# Patient Record
Sex: Female | Born: 1985 | Race: White | Hispanic: No | Marital: Single | Smoking: Never smoker
Health system: Southern US, Community
[De-identification: ages and names within clinical notes are randomized; demographics above are authoritative.]

---

## 2015-11-13 ENCOUNTER — Emergency Department: Payer: BLUE CROSS/BLUE SHIELD

## 2015-11-13 ENCOUNTER — Emergency Department
Admission: EM | Admit: 2015-11-13 | Discharge: 2015-11-13 | Disposition: A | Discharge status: Home / Self Care | Payer: BLUE CROSS/BLUE SHIELD | Attending: Emergency Medicine | Admitting: Emergency Medicine

## 2015-11-13 ENCOUNTER — Encounter: Payer: Self-pay | Admitting: *Deleted

## 2015-11-13 DIAGNOSIS — R509 Fever, unspecified: Secondary | ICD-10-CM | POA: Diagnosis not present

## 2015-11-13 DIAGNOSIS — R079 Chest pain, unspecified: Secondary | ICD-10-CM | POA: Insufficient documentation

## 2015-11-13 DIAGNOSIS — R739 Hyperglycemia, unspecified: Secondary | ICD-10-CM | POA: Diagnosis not present

## 2015-11-13 LAB — RAPID INFLUENZA A&B ANTIGENS (ARMC ONLY): INFLUENZA A (ARMC): NEGATIVE

## 2015-11-13 LAB — URINALYSIS COMPLETE WITH MICROSCOPIC (ARMC ONLY)
Bacteria, UA: NONE SEEN
Bilirubin Urine: NEGATIVE
Glucose, UA: >500 mg/dL — AB
HGB URINE DIPSTICK: NEGATIVE
Ketones, ur: NEGATIVE mg/dL
Leukocytes, UA: NEGATIVE
Nitrite: NEGATIVE
PROTEIN: NEGATIVE mg/dL
SPECIFIC GRAVITY, URINE: 1.001 — AB (ref 1.005–1.030)
WBC UA: NONE SEEN WBC/hpf (ref 0–5)
pH: 7 (ref 5.0–8.0)

## 2015-11-13 LAB — RAPID INFLUENZA A&B ANTIGENS: Influenza B (ARMC): NEGATIVE

## 2015-11-13 LAB — BASIC METABOLIC PANEL
Anion gap: 10 (ref 5–15)
BUN: 7 mg/dL (ref 6–20)
CALCIUM: 8.6 mg/dL — AB (ref 8.9–10.3)
CHLORIDE: 103 mmol/L (ref 101–111)
CO2: 20 mmol/L — AB (ref 22–32)
CREATININE: 0.87 mg/dL (ref 0.44–1.00)
GFR calc non Af Amer: >60 mL/min (ref >60)
GLUCOSE: 262 mg/dL — AB (ref 65–99)
Potassium: 3.5 mmol/L (ref 3.5–5.1)
Sodium: 133 mmol/L — ABNORMAL LOW (ref 135–145)

## 2015-11-13 LAB — POCT RAPID STREP A: Streptococcus, Group A Screen (Direct): NEGATIVE

## 2015-11-13 LAB — TROPONIN I: Troponin I: <0.03 ng/mL

## 2015-11-13 LAB — CBC
HCT: 39.3 % (ref 35.0–47.0)
Hemoglobin: 13.3 g/dL (ref 12.0–16.0)
MCH: 29.7 pg (ref 26.0–34.0)
MCHC: 33.7 g/dL (ref 32.0–36.0)
MCV: 88 fL (ref 80.0–100.0)
Platelets: 171 10*3/uL (ref 150–440)
RBC: 4.47 MIL/uL (ref 3.80–5.20)
RDW: 13.2 % (ref 11.5–14.5)
WBC: 3.5 10*3/uL — ABNORMAL LOW (ref 3.6–11.0)

## 2015-11-13 LAB — POCT PREGNANCY, URINE: Preg Test, Ur: NEGATIVE

## 2015-11-13 MED ORDER — METFORMIN HCL 500 MG PO TABS
500.0000 mg | ORAL_TABLET | Freq: Two times a day (BID) | ORAL | Status: AC
Start: 2015-11-13 — End: 2016-11-12

## 2015-11-13 MED ORDER — IBUPROFEN 800 MG PO TABS: 800.0000 mg | ORAL_TABLET | Freq: Three times a day (TID) | ORAL | Status: AC | PRN

## 2015-11-13 MED ORDER — IOPAMIDOL (ISOVUE-370) INJECTION 76%
75.0000 mL | Freq: Once | INTRAVENOUS | Status: AC | PRN
  Administered 2015-11-13: 75 mL via INTRAVENOUS

## 2015-11-13 MED ORDER — IBUPROFEN 800 MG PO TABS
800.0000 mg | ORAL_TABLET | Freq: Once | ORAL | Status: AC
  Administered 2015-11-13: 800 mg via ORAL
  Filled 2015-11-13: qty 1

## 2015-11-13 MED ORDER — METFORMIN HCL 500 MG PO TABS
500.0000 mg | ORAL_TABLET | Freq: Once | ORAL | Status: AC
  Administered 2015-11-13: 500 mg via ORAL
  Filled 2015-11-13: qty 1

## 2015-11-13 NOTE — ED Provider Notes (Signed)
[facility] Emergency Department Provider Note        Time seen: ----------------------------------------- 8:25 PM on 11/13/2015 -----------------------------------------    I have reviewed the triage vital signs and the nursing notes.   HISTORY  Chief Complaint Chest Pain    HPI Diane Nunez is a 30 y.o. female who presents ER for chest pain for last 2 days. She states the pain was worse since yesterday, also reports a fever. Fever started today with some chills. She has had some aches including headache and pressure Brown or ears. She denies any cough or nausea or vomiting or diarrhea.   No past medical history on file.  There are no active problems to display for this patient.   No past surgical history on file.  Allergies Percocet  Social History Social History  Substance Use Topics  . Smoking status: Never Smoker   . Smokeless tobacco: Not on file  . Alcohol Use: Yes    Review of Systems Constitutional: Positive for fever and chills Eyes: Negative for visual changes. ENT: Negative for sore throat. Cardiovascular: Positive for chest pain Respiratory: Negative for shortness of breath. Gastrointestinal: Negative for abdominal pain, vomiting and diarrhea. Genitourinary: Negative for dysuria. Musculoskeletal: Negative for back pain. Skin: Negative for rash. Neurological: Negative for headaches, focal weakness or numbness.  10-point ROS otherwise negative.  ____________________________________________   PHYSICAL EXAM:  VITAL SIGNS: ED Triage Vitals  Enc Vitals Group     BP 11/13/15 1921 160/79 mmHg     Pulse Rate 11/13/15 1921 102     Resp 11/13/15 1921 22     Temp 11/13/15 1921 100.3 F (37.9 C)     Temp Source 11/13/15 1921 Oral     SpO2 11/13/15 1921 100 %     Weight 11/13/15 1921 180 lb (81.647 kg)     Height 11/13/15 1921 5\' 2"  (1.575 m)     Head Cir --      Peak Flow --      Pain Score 11/13/15 1919 8      Pain Loc --      Pain Edu? --      Excl. in GC? --    Constitutional: Alert and oriented. Well appearing and in no distress. Eyes: Conjunctivae are normal. PERRL. Normal extraocular movements. ENT   Head: Normocephalic and atraumatic.   Nose: No congestion/rhinnorhea.   Mouth/Throat: Mucous membranes are moist.   Neck: No stridor. Cardiovascular: Normal rate, regular rhythm. No murmurs, rubs, or gallops. Respiratory: Normal respiratory effort without tachypnea nor retractions. Breath sounds are clear and equal bilaterally. No wheezes/rales/rhonchi. Gastrointestinal: Soft and nontender. Normal bowel sounds Musculoskeletal: Nontender with normal range of motion in all extremities. No lower extremity tenderness nor edema. Neurologic:  Normal speech and language. No gross focal neurologic deficits are appreciated.  Skin:  Skin is warm, dry and intact. No rash noted. Psychiatric: Mood and affect are normal. Speech and behavior are normal.  ____________________________________________  EKG: Interpreted by me. Sinus tachycardia with a rate of 102 bpm, normal PR interval, normal QT interval. Voltage criteria for LVH, nonspecific T-wave changes. Normal axis.  ____________________________________________  ED COURSE:  Pertinent labs & imaging results that were available during my care of the patient were reviewed by me and considered in my medical decision making (see chart for details). Patient presents with vague complaints of fever and chest pain. I will check basic labs and imaging. ____________________________________________    LABS (pertinent positives/negatives)  Labs Reviewed  CBC -  Abnormal; Notable for the following:    WBC 3.5 (*)    All other components within normal limits  BASIC METABOLIC PANEL - Abnormal; Notable for the following:    Sodium 133 (*)    CO2 20 (*)    Glucose, Bld 262 (*)    Calcium 8.6 (*)    All other components within normal limits   URINALYSIS COMPLETEWITH MICROSCOPIC ([facility] ONLY) - Abnormal; Notable for the following:    Color, Urine STRAW (*)    APPearance CLEAR (*)    Glucose, UA >500 (*)    Specific Gravity, Urine 1.001 (*)    Squamous Epithelial / LPF 0-5 (*)    All other components within normal limits  RAPID INFLUENZA A&B ANTIGENS ([facility] ONLY)  TROPONIN I  POCT PREGNANCY, URINE  POCT RAPID STREP A    RADIOLOGY  Chest x-ray IMPRESSION: No edema or consolidation. IMPRESSION: Negative for acute pulmonary embolism. There is diffuse fatty infiltration of the liver. No focal liver lesions are evident, but the entire liver is not within the field of view of this study. ____________________________________________  FINAL ASSESSMENT AND PLAN  Fever, chest pain, Hyperglycemia  Plan: Patient with labs and imaging as dictated above. Patient presents to ER with fever, viral symptoms and chest pain. Unclear etiology, will encourage Motrin 3 times a day to cover for pericarditis if she has a mild case of it. CT scan was unremarkable, she also appears to have developed type 2 diabetes and will be started on metformin. She is advised follow-up in 1-2 days for  Recheck with her doctor.   Emily Filbert, MD   Note: This dictation was prepared with Dragon dictation. Any transcriptional errors that result from this process are unintentional   Emily Filbert, MD 11/13/15 2228

## 2015-11-13 NOTE — Discharge Instructions (Signed)
Nonspecific Chest Pain It is often hard to find the cause of chest pain. There is always a chance that your pain could be related to something serious, such as a heart attack or a blood clot in your lungs. Chest pain can also be caused by conditions that are not life-threatening. If you have chest pain, it is very important to follow up with your doctor.  HOME CARE  If you were prescribed an antibiotic medicine, finish it all even if you start to feel better.  Avoid any activities that cause chest pain.  Do not use any tobacco products, including cigarettes, chewing tobacco, or electronic cigarettes. If you need help quitting, ask your doctor.  Do not drink alcohol.  Take medicines only as told by your doctor.  Keep all follow-up visits as told by your doctor. This is important. This includes any further testing if your chest pain does not go away.  Your doctor may tell you to keep your head raised (elevated) while you sleep.  Make lifestyle changes as told by your doctor. These may include:  Getting regular exercise. Ask your doctor to suggest some activities that are safe for you.  Eating a heart-healthy diet. Your doctor or a diet specialist (dietitian) can help you to learn healthy eating options.  Maintaining a healthy weight.  Managing diabetes, if necessary.  Reducing stress. GET HELP IF:  Your chest pain does not go away, even after treatment.  You have a rash with blisters on your chest.  You have a fever. GET HELP RIGHT AWAY IF:  Your chest pain is worse.  You have an increasing cough, or you cough up blood.  You have severe belly (abdominal) pain.  You feel extremely weak.  You pass out (faint).  You have chills.  You have sudden, unexplained chest discomfort.  You have sudden, unexplained discomfort in your arms, back, neck, or jaw.  You have shortness of breath at any time.  You suddenly start to sweat, or your skin gets clammy.  You feel  nauseous.  You vomit.  You suddenly feel light-headed or dizzy. Hyperglycemia Hyperglycemia occurs when the glucose (sugar) in your blood is too high. Hyperglycemia can happen for many reasons, but it most often happens to people who do not know they have diabetes or are not managing their diabetes properly.  CAUSES  Whether you have diabetes or not, there are other causes of hyperglycemia. Hyperglycemia can occur when you have diabetes, but it can also occur in other situations that you might not be as aware of, such as: Diabetes If you have diabetes and are having problems controlling your blood glucose, hyperglycemia could occur because of some of the following reasons: Not following your meal plan. Not taking your diabetes medications or not taking it properly. Exercising less or doing less activity than you normally do. Being sick. Pre-diabetes This cannot be ignored. Before people develop Type 2 diabetes, they almost always have "pre-diabetes." This is when your blood glucose levels are higher than normal, but not yet high enough to be diagnosed as diabetes. Research has shown that some long-term damage to the body, especially the heart and circulatory system, may already be occurring during pre-diabetes. If you take action to manage your blood glucose when you have pre-diabetes, you may delay or prevent Type 2 diabetes from developing. Stress If you have diabetes, you may be "diet" controlled or on oral medications or insulin to control your diabetes. However, you may find that your blood  glucose is higher than usual in the hospital whether you have diabetes or not. This is often referred to as "stress hyperglycemia." Stress can elevate your blood glucose. This happens because of hormones put out by the body during times of stress. If stress has been the cause of your high blood glucose, it can be followed regularly by your caregiver. That way he/she can make sure your hyperglycemia does not  continue to get worse or progress to diabetes. Steroids Steroids are medications that act on the infection fighting system (immune system) to block inflammation or infection. One side effect can be a rise in blood glucose. Most people can produce enough extra insulin to allow for this rise, but for those who cannot, steroids make blood glucose levels go even higher. It is not unusual for steroid treatments to "uncover" diabetes that is developing. It is not always possible to determine if the hyperglycemia will go away after the steroids are stopped. A special blood test called an A1c is sometimes done to determine if your blood glucose was elevated before the steroids were started. SYMPTOMS Thirsty. Frequent urination. Dry mouth. Blurred vision. Tired or fatigue. Weakness. Sleepy. Tingling in feet or leg. DIAGNOSIS  Diagnosis is made by monitoring blood glucose in one or all of the following ways: A1c test. This is a chemical found in your blood. Fingerstick blood glucose monitoring. Laboratory results. TREATMENT  First, knowing the cause of the hyperglycemia is important before the hyperglycemia can be treated. Treatment may include, but is not be limited to: Education. Change or adjustment in medications. Change or adjustment in meal plan. Treatment for an illness, infection, etc. More frequent blood glucose monitoring. Change in exercise plan. Decreasing or stopping steroids. Lifestyle changes. HOME CARE INSTRUCTIONS  Test your blood glucose as directed. Exercise regularly. Your caregiver will give you instructions about exercise. Pre-diabetes or diabetes which comes on with stress is helped by exercising. Eat wholesome, balanced meals. Eat often and at regular, fixed times. Your caregiver or nutritionist will give you a meal plan to guide your sugar intake. Being at an ideal weight is important. If needed, losing as little as 10 to 15 pounds may help improve blood glucose  levels. SEEK MEDICAL CARE IF:  You have questions about medicine, activity, or diet. You continue to have symptoms (problems such as increased thirst, urination, or weight gain). SEEK IMMEDIATE MEDICAL CARE IF:  You are vomiting or have diarrhea. Your breath smells fruity. You are breathing faster or slower. You are very sleepy or incoherent. You have numbness, tingling, or pain in your feet or hands. You have chest pain. Your symptoms get worse even though you have been following your caregiver's orders. If you have any other questions or concerns.   This information is not intended to replace advice given to you by your health care provider. Make sure you discuss any questions you have with your health care provider.   Document Released: 12/21/2000 Document Revised: 09/19/2011 Document Reviewed: 03/03/2015 Elsevier Interactive Patient Education [employer]   Your heart begins to beat quickly, or it feels like it is skipping beats. These symptoms may be an emergency. Do not wait to see if the symptoms will go away. Get medical help right away. Call your local emergency services (911 in the [place] Do not drive yourself to the hospital.   This information is not intended to replace advice given to you by your health care provider. Make sure you discuss any questions you have with  your health care provider.   Document Released: 12/14/2007 Document Revised: 07/18/2014 Document Reviewed: 01/31/2014 Elsevier Interactive Patient Education 2016 Else[employer] Inc. Viral Infections A viral infection can be caused by different types of viruses.Most viral infections are not serious and resolve on their own. However, some infections may cause severe symptoms and may lead to further complications. SYMPTOMS Viruses can frequently cause:  Minor sore throat.  Aches and pains.  Headaches.  Runny nose.  Different types of rashes.  Watery eyes.  Tiredness.  Cough.  Loss of  appetite.  Gastrointestinal infections, resulting in nausea, vomiting, and diarrhea. These symptoms do not respond to antibiotics because the infection is not caused by bacteria. However, you might catch a bacterial infection following the viral infection. This is sometimes called a "superinfection." Symptoms of such a bacterial infection may include:  Worsening sore throat with pus and difficulty swallowing.  Swollen neck glands.  Chills and a high or persistent fever.  Severe headache.  Tenderness over the sinuses.  Persistent overall ill feeling (malaise), muscle aches, and tiredness (fatigue).  Persistent cough.  Yellow, green, or brown mucus production with coughing. HOME CARE INSTRUCTIONS   Only take over-the-counter or prescription medicines for pain, discomfort, diarrhea, or fever as directed by your caregiver.  Drink enough water and fluids to keep your urine clear or pale yellow. Sports drinks can provide valuable electrolytes, sugars, and hydration.  Get plenty of rest and maintain proper nutrition. Soups and broths with crackers or rice are fine. SEEK IMMEDIATE MEDICAL CARE IF:   You have severe headaches, shortness of breath, chest pain, neck pain, or an unusual rash.  You have uncontrolled vomiting, diarrhea, or you are unable to keep down fluids.  You or your child has an oral temperature above 102 F (38.9 C), not controlled by medicine.  Your baby is older than 3 months with a rectal temperature of 102 F (38.9 C) or higher.  Your baby is 61 months old or younger with a rectal temperature of 100.4 F (38 C) or higher. MAKE SURE YOU:   Understand these instructions.  Will watch your condition.  Will get help right away if you are not doing well or get worse.   This information is not intended to replace advice given to you by your health care provider. Make sure you discuss any questions you have with your health care provider.   Document Released:  04/06/2005 Document Revised: 09/19/2011 Document Reviewed: 12/03/2014 Elsevier Interactive Patient Education [employer]

## 2015-11-13 NOTE — ED Notes (Addendum)
Pt reports chest pain for 2 days.  States pain worse in left chest since yesterday.   Pt also reports a fever.  Pt took tylenol at 1800  No cough.  No n/v/d.  Pt has a headache and pressure behind the ears.  Pt alert.  Speech clear.

## 2015-11-13 NOTE — ED Notes (Addendum)
Pt reports chest pain and shortness of breath for 2 days - laying down increases pain and sitting straight up relieves the pain - chest pain is on left side of chest and feels like stabbing pain with deep breath - pt reports difficulty taking deep breath yet respirations are even and unlabored and no distress obs at this time - pt also reports cramps in calves and pain in lower base of skull and being tired

## 2016-09-25 IMAGING — CT CT ANGIO CHEST
1 of 2 series · 18 of 30 positions shown · IV contrast (APPLIED)
Comparison: Radiographs 11/13/2015

CLINICAL DATA: Chest pain and dyspnea for 2 days, positional.

EXAM:
CT ANGIOGRAPHY CHEST WITH CONTRAST
TECHNIQUE: Multidetector CT imaging of the chest was performed using the
standard protocol during bolus administration of intravenous
contrast. Multiplanar CT image reconstructions and MIPs were
obtained to evaluate the vascular anatomy.
CONTRAST:  75 mL Isovue 370 intravenous

[Series 6: pe 1.0 thins · axial · 0.66mm/px · z∈[-693,-452]mm · 18 of 273 slices shown]
[im 16/273  lung]
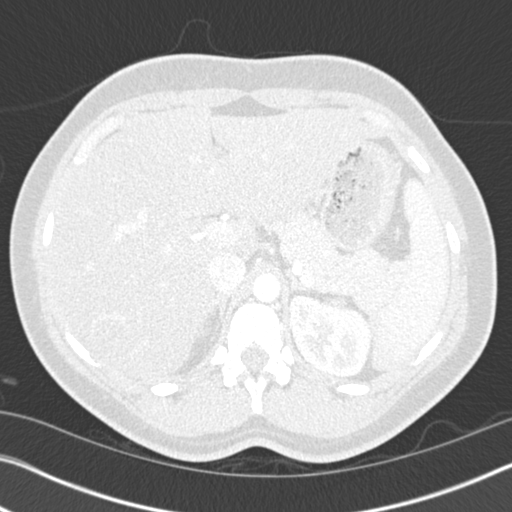
[im 31/273  mediastinal]
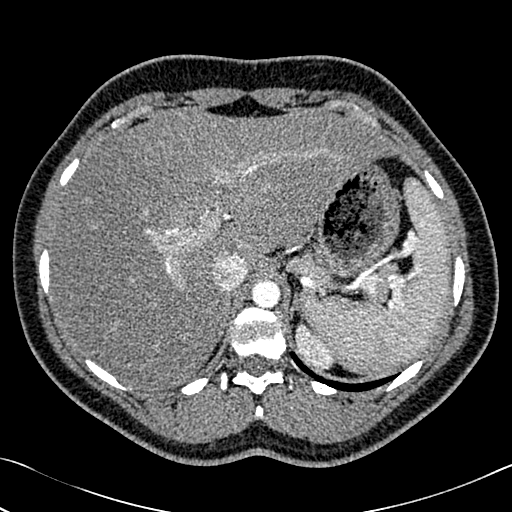
[im 46/273  lung]
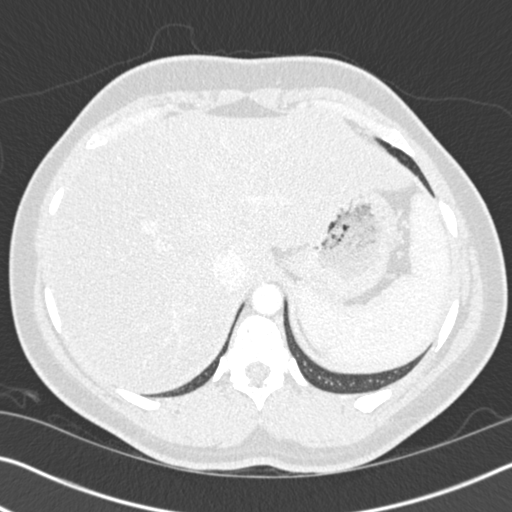
[im 61/273  mediastinal]
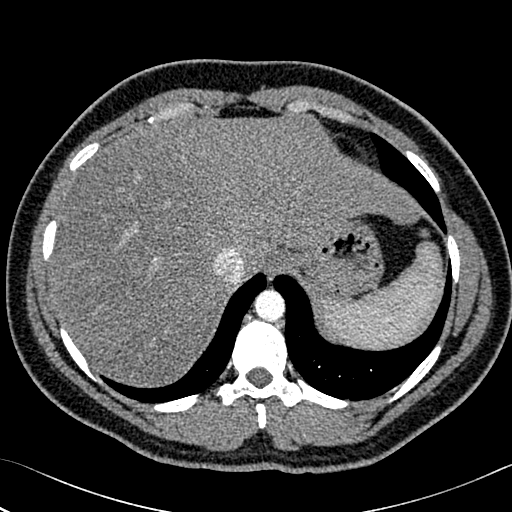
[im 76/273  lung]
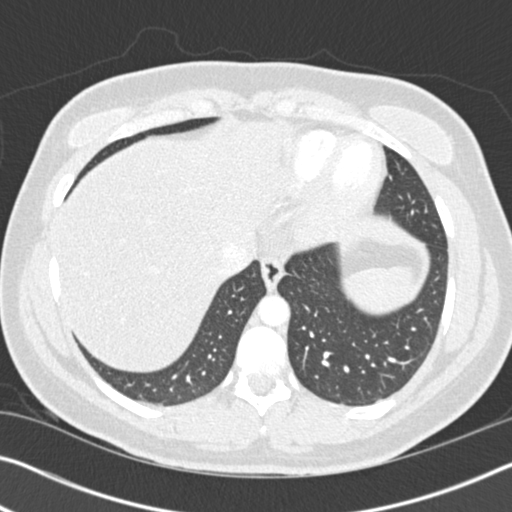
[im 91/273  mediastinal]
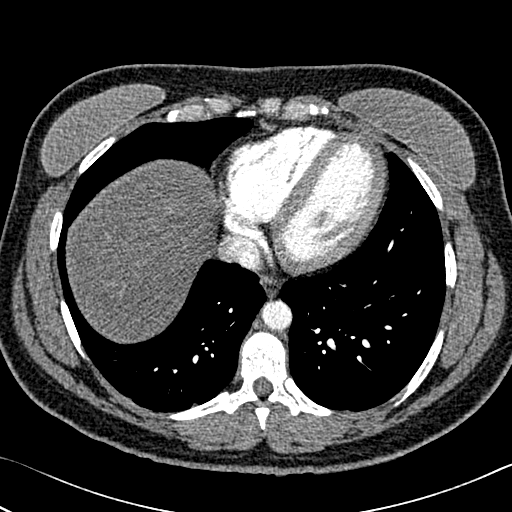
[im 106/273  lung]
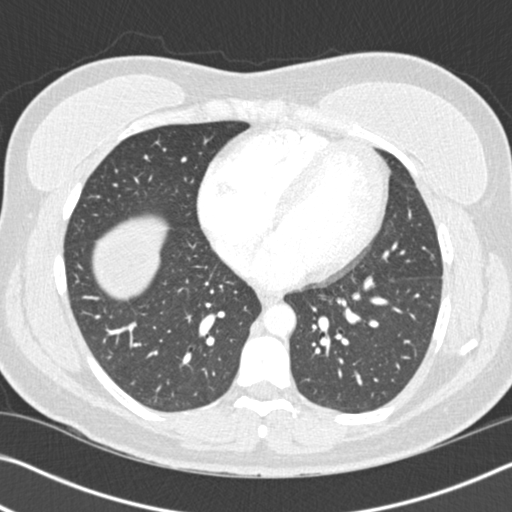
[im 121/273  mediastinal]
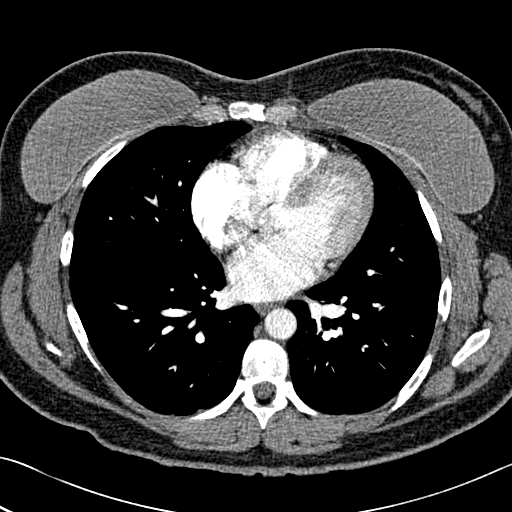
[im 127/273  lung]
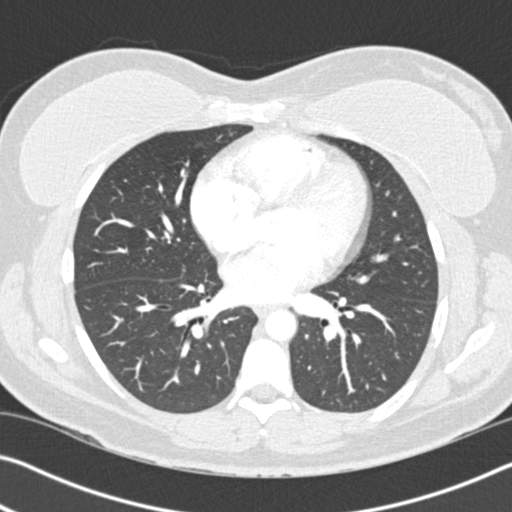
[im 137/273  mediastinal]
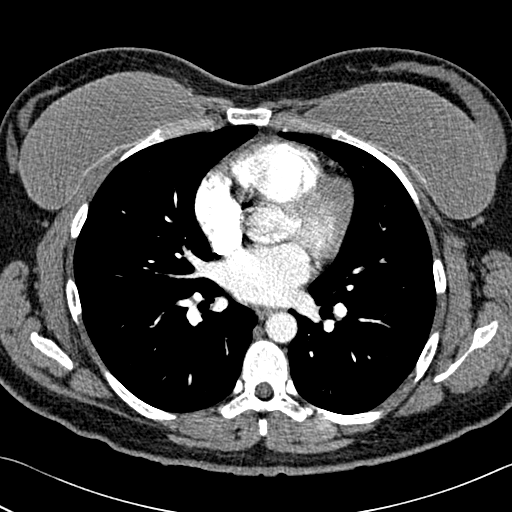
[im 152/273  lung]
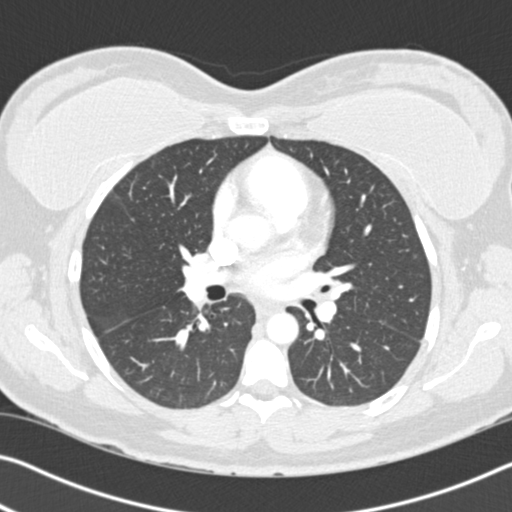
[im 167/273  mediastinal]
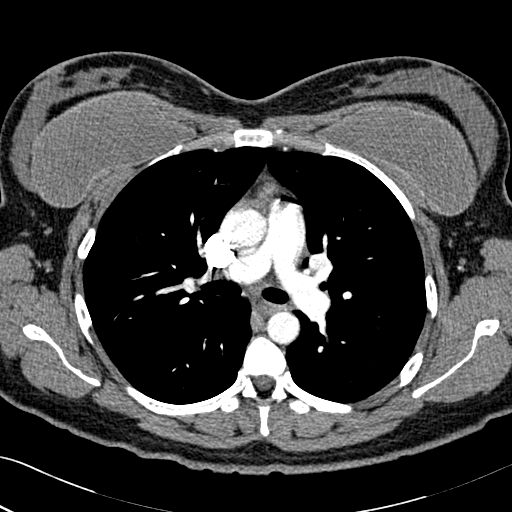
[im 182/273  lung]
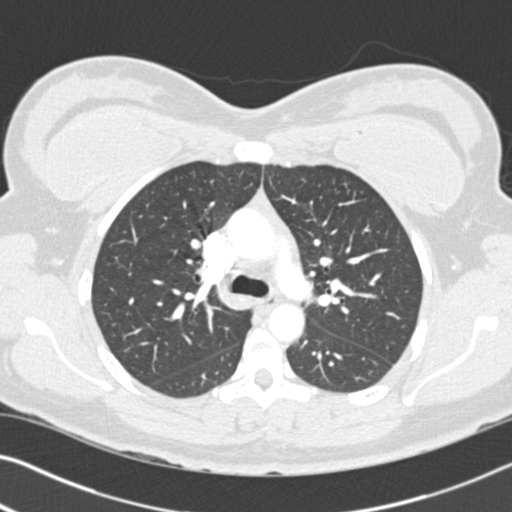
[im 197/273  mediastinal]
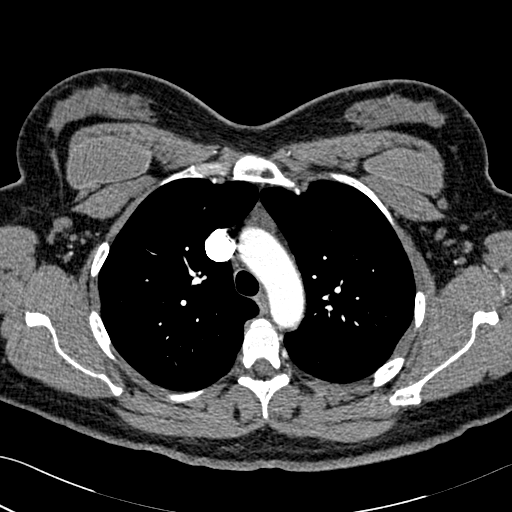
[im 212/273  lung]
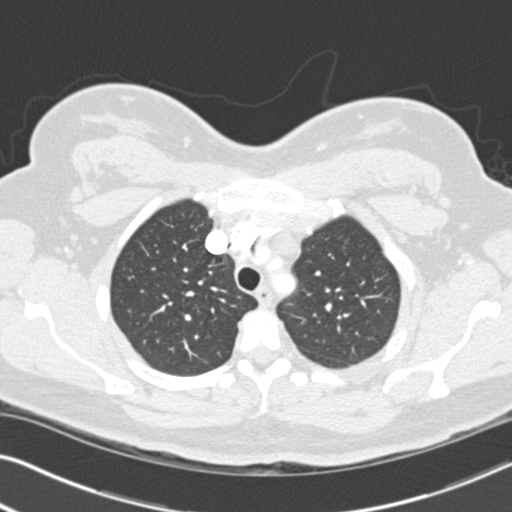
[im 227/273  mediastinal]
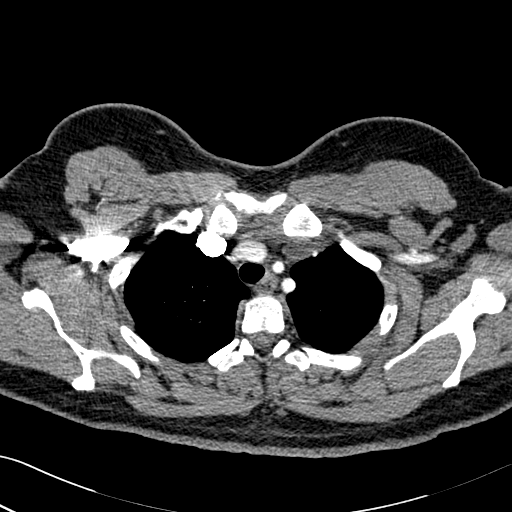
[im 242/273  lung]
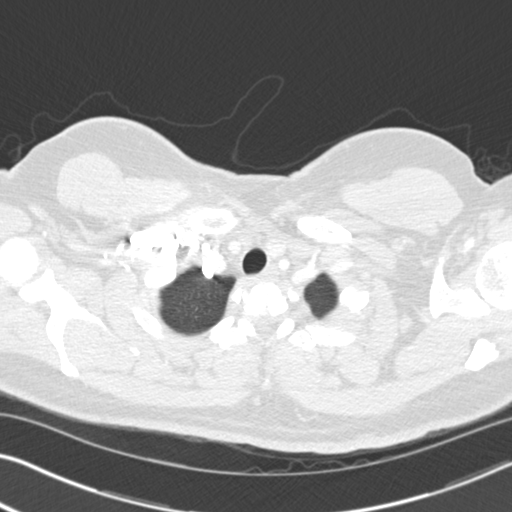
[im 257/273  mediastinal]
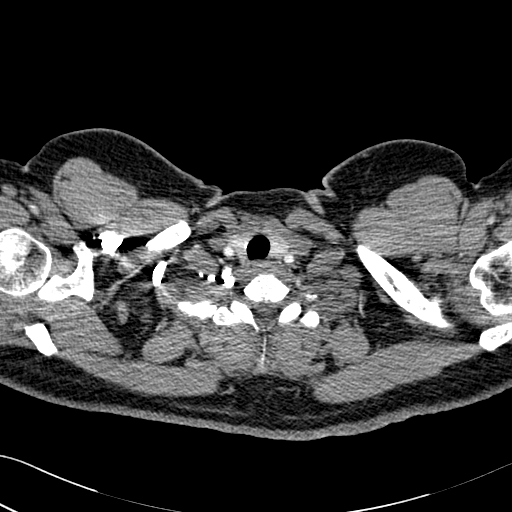

[18 of 30 positions shown; findings below may reference images not displayed]

FINDINGS: Cardiovascular: There is good opacification of the pulmonary
arteries. There is no pulmonary embolism. The thoracic aorta is
normal in caliber and intact.

Lungs: Clear

Central airways: Normal

Effusions: None

Lymphadenopathy: None

Esophagus: Unremarkable

Upper abdomen: Diffuse fatty infiltration of the liver, incompletely
imaged. No focal liver lesions are evident.

Musculoskeletal: No significant abnormality

Review of the MIP images confirms the above findings.
IMPRESSION: Negative for acute pulmonary embolism. There is diffuse fatty
infiltration of the liver. No focal liver lesions are evident, but
the entire liver is not within the field of view of this study.
